# Patient Record
Sex: Female | Born: 1960 | Race: Black or African American | Hispanic: No | Marital: Married
Health system: Southern US, Community
[De-identification: ages and names within clinical notes are randomized; demographics above are authoritative.]

---

## 2006-07-29 ENCOUNTER — Emergency Department: Payer: Self-pay | Admitting: Emergency Medicine

## 2008-01-05 ENCOUNTER — Emergency Department: Payer: Self-pay | Admitting: Emergency Medicine

## 2008-04-18 ENCOUNTER — Ambulatory Visit: Payer: Self-pay | Admitting: Gastroenterology

## 2008-04-30 ENCOUNTER — Ambulatory Visit: Payer: Self-pay | Admitting: Gastroenterology

## 2008-05-23 ENCOUNTER — Ambulatory Visit: Payer: Self-pay | Admitting: Gastroenterology

## 2011-03-01 ENCOUNTER — Emergency Department: Payer: Self-pay | Admitting: Internal Medicine

## 2011-03-27 ENCOUNTER — Ambulatory Visit: Payer: Self-pay | Admitting: Obstetrics and Gynecology

## 2011-07-11 ENCOUNTER — Emergency Department: Payer: Self-pay | Admitting: Emergency Medicine

## 2011-07-11 LAB — COMPREHENSIVE METABOLIC PANEL
Albumin: 4 g/dL (ref 3.4–5.0)
Alkaline Phosphatase: 78 U/L (ref 50–136)
Anion Gap: 9 (ref 7–16)
Bilirubin,Total: 0.3 mg/dL (ref 0.2–1.0)
Calcium, Total: 8.6 mg/dL (ref 8.5–10.1)
Chloride: 106 mmol/L (ref 98–107)
Creatinine: 0.99 mg/dL (ref 0.60–1.30)
EGFR (African American): >60
Osmolality: 276 (ref 275–301)
Potassium: 3.6 mmol/L (ref 3.5–5.1)
SGOT(AST): 19 U/L (ref 15–37)
Sodium: 138 mmol/L (ref 136–145)

## 2011-07-11 LAB — URINALYSIS, COMPLETE
Blood: NEGATIVE
Nitrite: NEGATIVE
Ph: 5 (ref 4.5–8.0)
Protein: NEGATIVE
RBC,UR: 1 /HPF (ref 0–5)
Specific Gravity: 1.006 (ref 1.003–1.030)
Squamous Epithelial: 2

## 2011-07-11 LAB — CK TOTAL AND CKMB (NOT AT ARMC)
CK, Total: 89 U/L (ref 21–215)
CK-MB: 0.7 ng/mL (ref 0.5–3.6)

## 2011-07-11 LAB — CBC
HGB: 12.7 g/dL (ref 12.0–16.0)
MCHC: 33.4 g/dL (ref 32.0–36.0)
MCV: 96 fL (ref 80–100)

## 2011-07-11 LAB — TROPONIN I: Troponin-I: <0.02 ng/mL

## 2011-08-12 ENCOUNTER — Ambulatory Visit: Payer: Self-pay | Admitting: Obstetrics and Gynecology

## 2011-08-12 LAB — POTASSIUM: Potassium: 3.8 mmol/L (ref 3.5–5.1)

## 2011-08-12 LAB — URINALYSIS, COMPLETE
Bilirubin,UR: NEGATIVE
Blood: NEGATIVE
Hyaline Cast: 7
Leukocyte Esterase: NEGATIVE
Protein: NEGATIVE
Specific Gravity: 1.015 (ref 1.003–1.030)
WBC UR: 1 /HPF (ref 0–5)

## 2011-08-17 ENCOUNTER — Ambulatory Visit: Payer: Self-pay | Admitting: Obstetrics and Gynecology

## 2011-08-17 LAB — PREGNANCY, URINE: Pregnancy Test, Urine: NEGATIVE m[IU]/mL

## 2011-08-18 LAB — HEMATOCRIT: HCT: 34 % — ABNORMAL LOW (ref 35.0–47.0)

## 2012-07-05 ENCOUNTER — Ambulatory Visit: Payer: Self-pay | Admitting: Internal Medicine

## 2012-10-19 ENCOUNTER — Emergency Department: Payer: Self-pay | Admitting: Emergency Medicine

## 2012-10-19 LAB — URINALYSIS, COMPLETE
Bacteria: NONE SEEN
Bilirubin,UR: NEGATIVE
Blood: NEGATIVE
Ketone: NEGATIVE
Nitrite: NEGATIVE
Ph: 5 (ref 4.5–8.0)
Protein: NEGATIVE
RBC,UR: 1 /HPF (ref 0–5)
Specific Gravity: 1.017 (ref 1.003–1.030)
Squamous Epithelial: 3
WBC UR: <1 /HPF (ref 0–5)

## 2012-10-19 LAB — COMPREHENSIVE METABOLIC PANEL
Albumin: 3.4 g/dL (ref 3.4–5.0)
Alkaline Phosphatase: 90 U/L (ref 50–136)
Anion Gap: 4 — ABNORMAL LOW (ref 7–16)
Calcium, Total: 8.5 mg/dL (ref 8.5–10.1)
Chloride: 108 mmol/L — ABNORMAL HIGH (ref 98–107)
Co2: 27 mmol/L (ref 21–32)
Creatinine: 0.98 mg/dL (ref 0.60–1.30)
EGFR (African American): >60
EGFR (Non-African Amer.): >60
Glucose: 94 mg/dL (ref 65–99)
Osmolality: 278 (ref 275–301)
Potassium: 4.1 mmol/L (ref 3.5–5.1)
Total Protein: 6.9 g/dL (ref 6.4–8.2)

## 2012-10-19 LAB — CBC
RBC: 3.73 10*6/uL — ABNORMAL LOW (ref 3.80–5.20)
RDW: 14.7 % — ABNORMAL HIGH (ref 11.5–14.5)
WBC: 7.5 10*3/uL (ref 3.6–11.0)

## 2013-08-08 ENCOUNTER — Ambulatory Visit: Payer: Self-pay | Admitting: Internal Medicine

## 2014-07-27 ENCOUNTER — Other Ambulatory Visit: Payer: Self-pay | Admitting: Internal Medicine

## 2014-07-27 DIAGNOSIS — Z1231 Encounter for screening mammogram for malignant neoplasm of breast: Secondary | ICD-10-CM

## 2014-08-05 NOTE — Op Note (Signed)
PATIENT NAME:  Janet Cisneros, Janet Cisneros MR#:  [id number] DATE OF BIRTH:  15-Apr-1960  DATE OF PROCEDURE:  08/17/2011  PREOPERATIVE DIAGNOSES: Dysmenorrhea, menorrhagia, and adnexal mass.   POSTOPERATIVE DIAGNOSES: Dysmenorrhea, menorrhagia, adnexal mass, and bilateral hydrosalpinx.  PROCEDURES: Laparoscopic supracervical hysterectomy, bilateral salpingectomy.   SURGEON: Ricky L. Logan BoresEvans, MD  ASSISTANT: Jennell Cornerhomas Schermerhorn, MD  ANESTHESIA: General endotracheal.   FINDINGS: Grossly normal uterus, large fluid-filled right fallopian tube, smaller fallopian tube that distorted and adherent on the left side, grossly normal ovaries, and excellent hemostasis.   ESTIMATED BLOOD LOSS: 75 mL.   COMPLICATIONS: None.   SPECIMENS: Uterus and bilateral tubes.   DRAINS: Foley intraoperatively, removed at the end of the case.   PROCEDURE IN DETAIL: The patient consented, preoperative antibiotics given, and taken to operating room and placed in the dorsal lithotomy position, sedated, and prepped and draped in the usual sterile fashion. The cervix was visualized. Single tooth and sound were placed in the usual fashion. A Foley catheter was placed.   A #10 port was placed infraumbilically and Optiview was placed with entry into the peritoneal cavity and pneumoperitoneum established. The patient was placed in Trendelenburg. 10 mm were ports placed in bilateral lower quadrants. [facility] in the usual fashion using the Harmonic scalpel and Kleppingers. The endocervical canal and cervical stump were cauterized.   The right fallopian tube was grasped and with the aid of the Harmonic was excised and sent separately. Left fallopian tube was dissected off of an adherent left ovary and was delivered and sent for permanent specimen as well.   The uterus was removed in morcellator fashion through the left lower quadrant port site. The pelvis was copiously irrigated. The area seemed to be hemostatic, both before and  after lowing pressure to 6 mmHg.   At this point, the procedure was felt to have achieved maximum efficacy. Ports were removed and the pneumoperitoneum was allowed to resolve. The patient was returned to the supine position. The incisions were closed with deep of zero and subcutaneous with 3-0 Vicryl. Steri-Strips and Band-Aids were placed.   All  instrument, needle, and sponge counts were correct. The Foley was removed at the end of the case. 1 gram of Ancef was given IV preoperatively. I anticipate a routine postoperative course.  ____________________________ Reatha Harpsicky L. Logan BoresEvans, MD rle:slb D: 08/17/2011 08:32:14 ET Cisneros: 08/17/2011 10:17:18 ET JOB#: [id number]  cc: Ricky L. Logan BoresEvans, MD, <Dictator> Augustina MoodICK L Starasia Sinko MD ELECTRONICALLY SIGNED 08/18/2011 9:18

## 2014-08-13 ENCOUNTER — Ambulatory Visit
Admission: RE | Admit: 2014-08-13 | Discharge: 2014-08-13 | Disposition: A | Discharge status: Home / Self Care | Payer: BLUE CROSS/BLUE SHIELD | Source: Ambulatory Visit | Attending: Internal Medicine | Admitting: Internal Medicine

## 2014-08-13 DIAGNOSIS — Z1231 Encounter for screening mammogram for malignant neoplasm of breast: Secondary | ICD-10-CM | POA: Insufficient documentation

## 2015-01-07 ENCOUNTER — Other Ambulatory Visit: Payer: Self-pay | Admitting: Physical Medicine and Rehabilitation

## 2015-01-07 DIAGNOSIS — M5416 Radiculopathy, lumbar region: Secondary | ICD-10-CM

## 2015-01-15 ENCOUNTER — Ambulatory Visit: Payer: BLUE CROSS/BLUE SHIELD

## 2015-01-19 ENCOUNTER — Ambulatory Visit
Admission: RE | Admit: 2015-01-19 | Discharge: 2015-01-19 | Disposition: A | Discharge status: Home / Self Care | Payer: BLUE CROSS/BLUE SHIELD | Source: Ambulatory Visit | Attending: Physical Medicine and Rehabilitation | Admitting: Physical Medicine and Rehabilitation

## 2015-01-19 DIAGNOSIS — M4806 Spinal stenosis, lumbar region: Secondary | ICD-10-CM | POA: Diagnosis not present

## 2015-01-19 DIAGNOSIS — M5126 Other intervertebral disc displacement, lumbar region: Secondary | ICD-10-CM | POA: Insufficient documentation

## 2015-01-19 DIAGNOSIS — M5136 Other intervertebral disc degeneration, lumbar region: Secondary | ICD-10-CM | POA: Diagnosis not present

## 2015-01-19 DIAGNOSIS — M5416 Radiculopathy, lumbar region: Secondary | ICD-10-CM | POA: Diagnosis present

## 2016-01-01 ENCOUNTER — Ambulatory Visit
Admission: RE | Admit: 2016-01-01 | Discharge: 2016-01-01 | Disposition: A | Discharge status: Home / Self Care | Payer: BLUE CROSS/BLUE SHIELD | Source: Ambulatory Visit | Attending: Internal Medicine | Admitting: Internal Medicine

## 2016-01-01 ENCOUNTER — Other Ambulatory Visit: Payer: Self-pay | Admitting: Internal Medicine

## 2016-01-01 DIAGNOSIS — Z1231 Encounter for screening mammogram for malignant neoplasm of breast: Secondary | ICD-10-CM | POA: Diagnosis present

## 2017-12-14 ENCOUNTER — Other Ambulatory Visit: Payer: Self-pay | Admitting: Internal Medicine

## 2017-12-14 DIAGNOSIS — Z1231 Encounter for screening mammogram for malignant neoplasm of breast: Secondary | ICD-10-CM

## 2017-12-18 IMAGING — MG MM DIGITAL SCREENING BILAT W/ TOMO W/ CAD
8 of 12 series · 8 of 28 positions shown · non-contrast
Comparison: Previous exam(s).

CLINICAL DATA: Screening.

EXAM:
2D DIGITAL SCREENING BILATERAL MAMMOGRAM WITH CAD AND ADJUNCT TOMO

[R MLO]
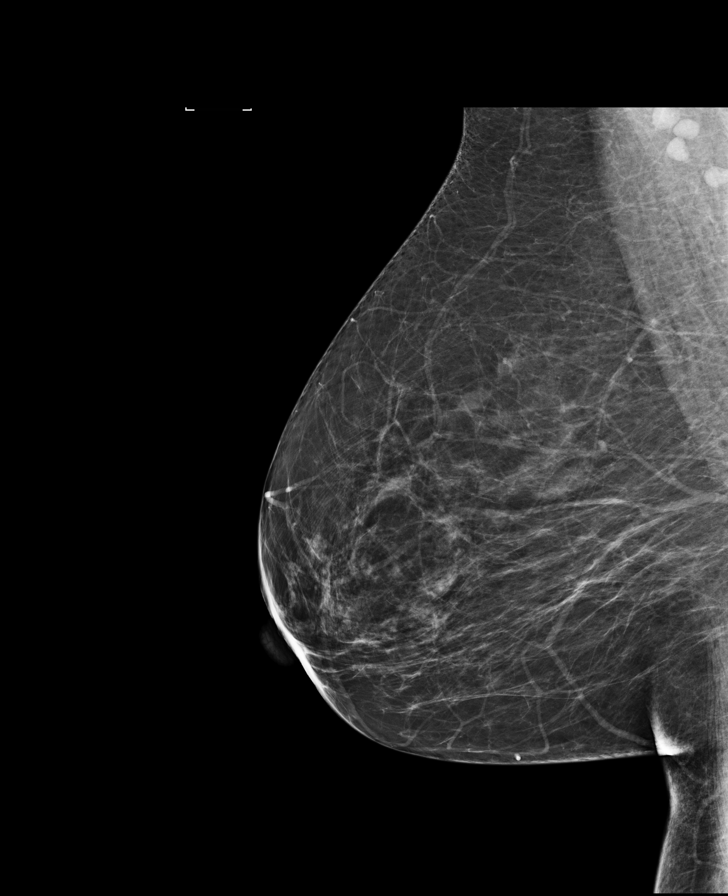

[L MLO]
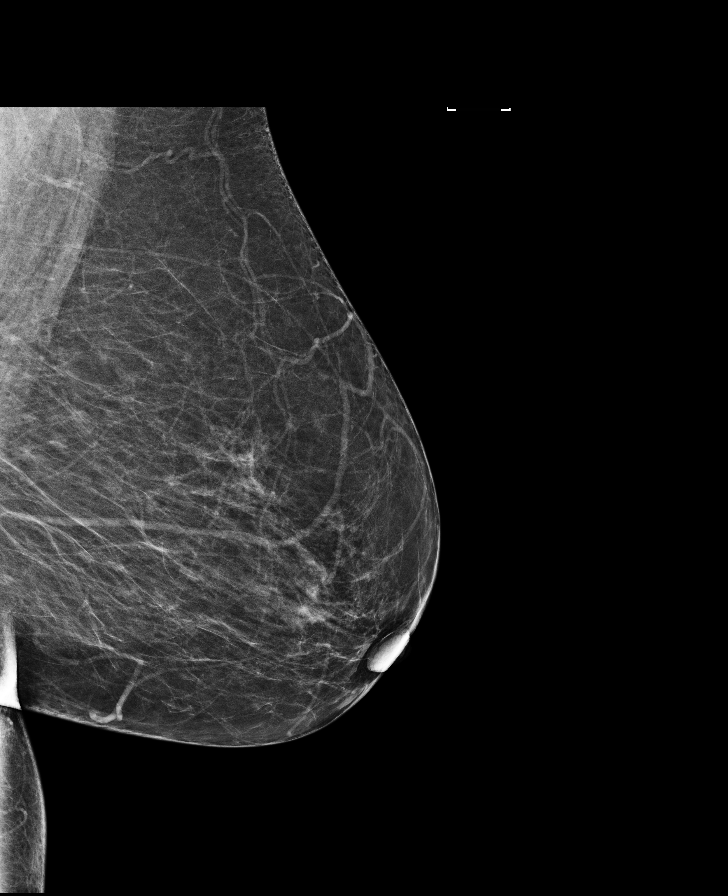

[L CC]
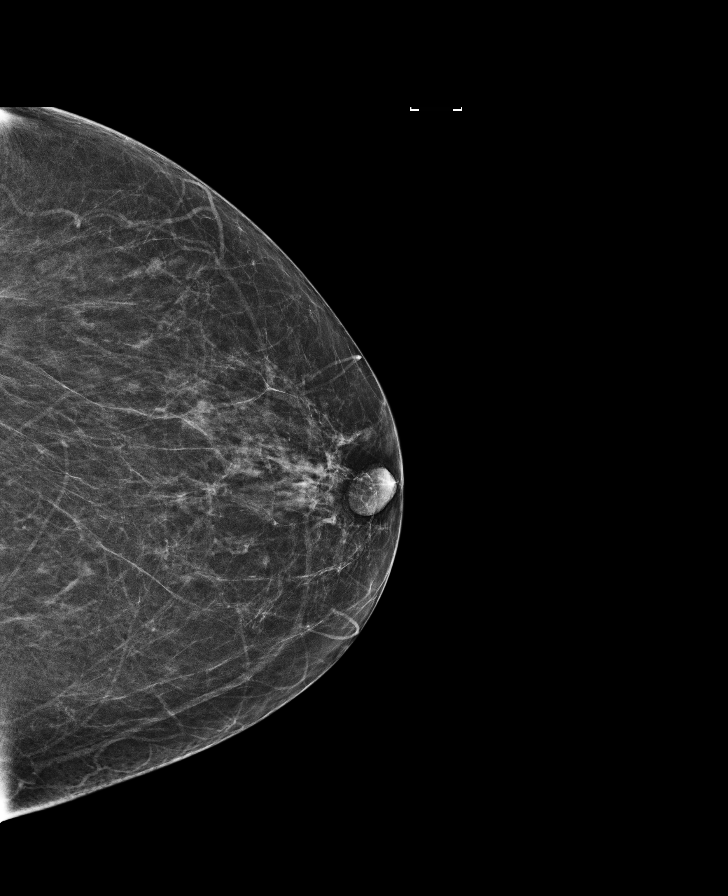

[L MLO synth-2D]
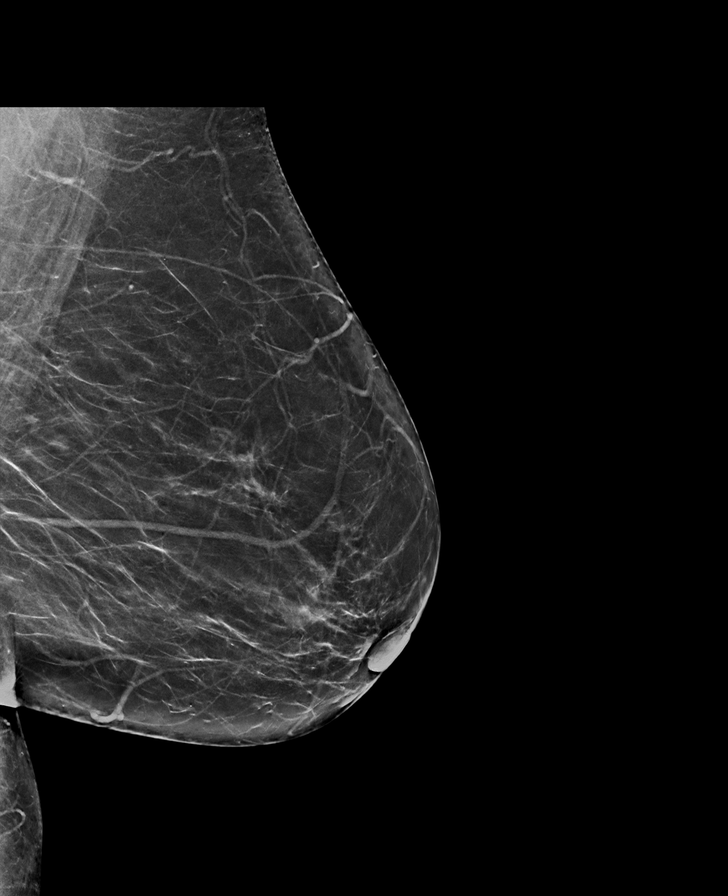

[L CC synth-2D]
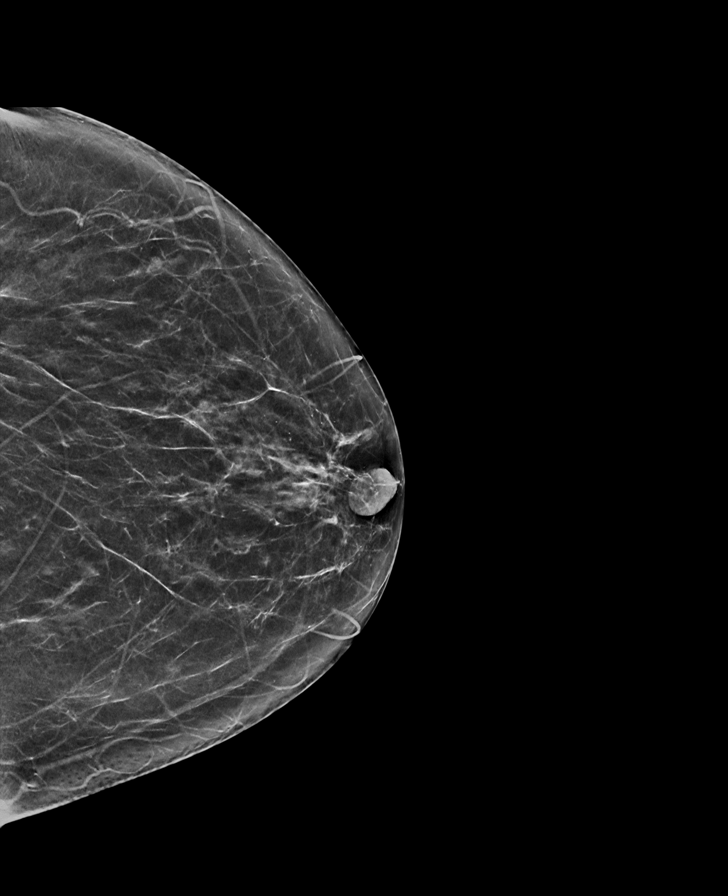

[R CC synth-2D]
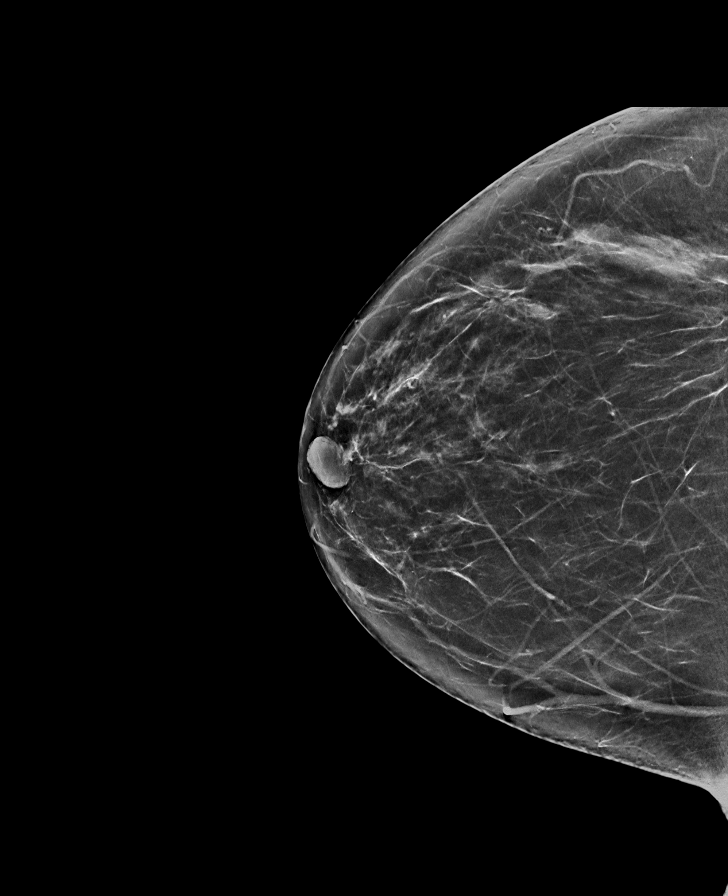

[R CC]
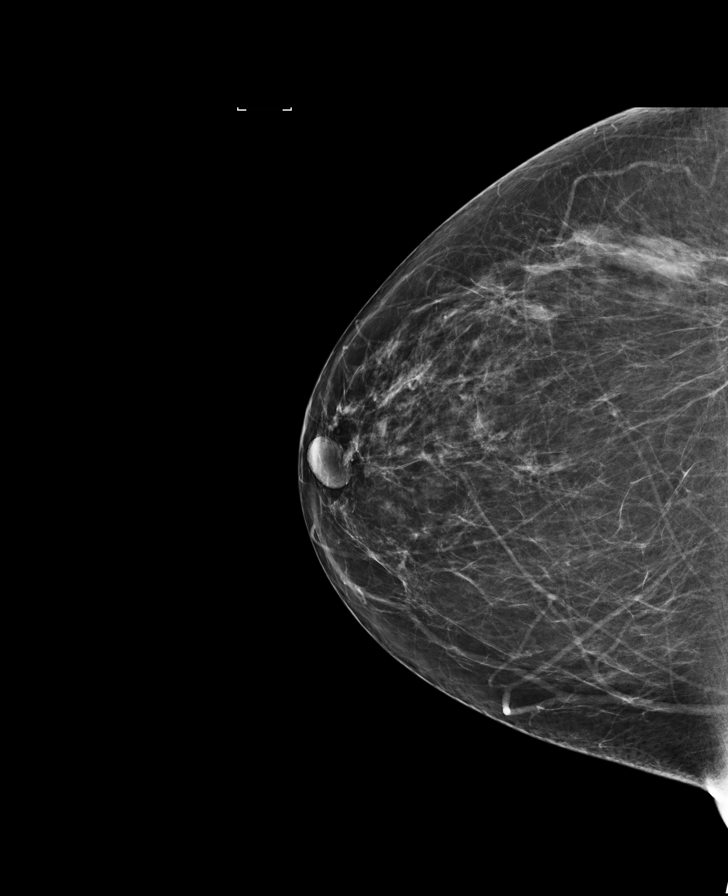

[R MLO synth-2D]
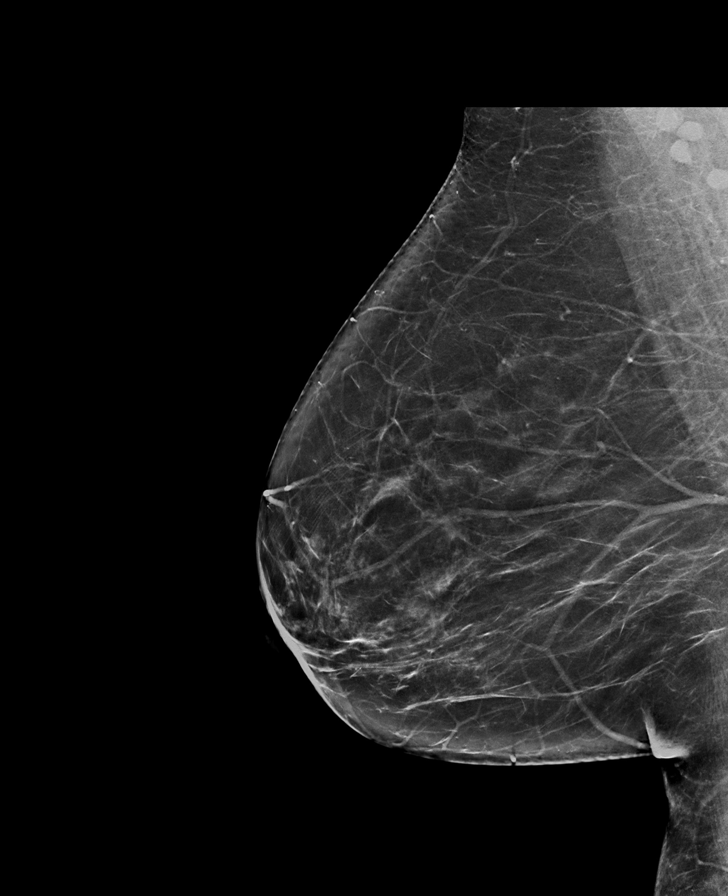

[8 of 28 positions shown; findings below may reference images not displayed]

ACR Breast Density Category b: There are scattered areas of
fibroglandular density.
FINDINGS: There are no findings suspicious for malignancy. Images were
processed with CAD.
IMPRESSION: No mammographic evidence of malignancy. A result letter of this
screening mammogram will be mailed directly to the patient.

RECOMMENDATION:
Screening mammogram in one year. (Code:97-6-RS4)

BI-RADS CATEGORY  1: Negative.

## 2019-03-21 ENCOUNTER — Other Ambulatory Visit: Payer: Self-pay

## 2019-03-21 DIAGNOSIS — Z20822 Contact with and (suspected) exposure to covid-19: Secondary | ICD-10-CM

## 2019-03-23 LAB — NOVEL CORONAVIRUS, NAA: SARS-CoV-2, NAA: DETECTED — AB

## 2019-09-04 ENCOUNTER — Other Ambulatory Visit: Payer: Self-pay | Admitting: Internal Medicine

## 2020-01-09 ENCOUNTER — Other Ambulatory Visit: Payer: Self-pay | Admitting: Internal Medicine

## 2020-01-10 ENCOUNTER — Other Ambulatory Visit: Payer: Self-pay | Admitting: Internal Medicine

## 2020-01-10 DIAGNOSIS — M4807 Spinal stenosis, lumbosacral region: Secondary | ICD-10-CM

## 2020-01-11 ENCOUNTER — Other Ambulatory Visit: Payer: Self-pay | Admitting: Internal Medicine

## 2020-01-11 DIAGNOSIS — Z1231 Encounter for screening mammogram for malignant neoplasm of breast: Secondary | ICD-10-CM

## 2020-04-04 DIAGNOSIS — Z20828 Contact with and (suspected) exposure to other viral communicable diseases: Secondary | ICD-10-CM | POA: Diagnosis not present

## 2021-04-24 ENCOUNTER — Other Ambulatory Visit (HOSPITAL_COMMUNITY): Payer: Self-pay
